# Patient Record
Sex: Male | Born: 1985 | Race: White | Hispanic: No | Marital: Single | Smoking: Former smoker
Health system: Southern US, Community
[De-identification: ages and names within clinical notes are randomized; demographics above are authoritative.]

## PROBLEM LIST (undated history)

## (undated) DIAGNOSIS — J45909 Unspecified asthma, uncomplicated: Secondary | ICD-10-CM

## (undated) DIAGNOSIS — I10 Essential (primary) hypertension: Secondary | ICD-10-CM

---

## 2015-10-02 ENCOUNTER — Encounter (HOSPITAL_COMMUNITY): Payer: Self-pay

## 2015-10-02 ENCOUNTER — Emergency Department (HOSPITAL_COMMUNITY): Payer: PRIVATE HEALTH INSURANCE

## 2015-10-02 ENCOUNTER — Emergency Department (HOSPITAL_COMMUNITY)
Admission: EM | Admit: 2015-10-02 | Discharge: 2015-10-02 | Disposition: A | Discharge status: Home / Self Care | Payer: PRIVATE HEALTH INSURANCE | Attending: Emergency Medicine | Admitting: Emergency Medicine

## 2015-10-02 DIAGNOSIS — R9431 Abnormal electrocardiogram [ECG] [EKG]: Secondary | ICD-10-CM | POA: Diagnosis not present

## 2015-10-02 DIAGNOSIS — Z720 Tobacco use: Secondary | ICD-10-CM | POA: Diagnosis not present

## 2015-10-02 DIAGNOSIS — R079 Chest pain, unspecified: Secondary | ICD-10-CM | POA: Diagnosis present

## 2015-10-02 DIAGNOSIS — R0789 Other chest pain: Secondary | ICD-10-CM | POA: Insufficient documentation

## 2015-10-02 DIAGNOSIS — J45909 Unspecified asthma, uncomplicated: Secondary | ICD-10-CM | POA: Insufficient documentation

## 2015-10-02 HISTORY — DX: Unspecified asthma, uncomplicated: J45.909

## 2015-10-02 LAB — CBC WITH DIFFERENTIAL/PLATELET
BASOS PCT: 0 %
Basophils Absolute: 0 10*3/uL (ref 0.0–0.1)
EOS ABS: 0 10*3/uL (ref 0.0–0.7)
Eosinophils Relative: 0 %
HEMATOCRIT: 41.7 % (ref 39.0–52.0)
Hemoglobin: 14.5 g/dL (ref 13.0–17.0)
LYMPHS ABS: 2 10*3/uL (ref 0.7–4.0)
Lymphocytes Relative: 18 %
MCH: 32.8 pg (ref 26.0–34.0)
MCHC: 34.8 g/dL (ref 30.0–36.0)
MCV: 94.3 fL (ref 78.0–100.0)
MONO ABS: 0.9 10*3/uL (ref 0.1–1.0)
MONOS PCT: 9 %
Neutro Abs: 7.9 10*3/uL — ABNORMAL HIGH (ref 1.7–7.7)
Neutrophils Relative %: 73 %
Platelets: 260 10*3/uL (ref 150–400)
RBC: 4.42 MIL/uL (ref 4.22–5.81)
RDW: 12.1 % (ref 11.5–15.5)
WBC: 10.8 10*3/uL — ABNORMAL HIGH (ref 4.0–10.5)

## 2015-10-02 LAB — BASIC METABOLIC PANEL
Anion gap: 7 (ref 5–15)
BUN: 8 mg/dL (ref 6–20)
CHLORIDE: 102 mmol/L (ref 101–111)
CO2: 26 mmol/L (ref 22–32)
CREATININE: 1.04 mg/dL (ref 0.61–1.24)
Calcium: 8.8 mg/dL — ABNORMAL LOW (ref 8.9–10.3)
GFR calc Af Amer: >60 mL/min (ref >60)
GFR calc non Af Amer: >60 mL/min (ref >60)
GLUCOSE: 127 mg/dL — AB (ref 65–99)
POTASSIUM: 3.8 mmol/L (ref 3.5–5.1)
SODIUM: 135 mmol/L (ref 135–145)

## 2015-10-02 LAB — TROPONIN I: TROPONIN I: 0.03 ng/mL (ref <0.031)

## 2015-10-02 MED ORDER — KETOROLAC TROMETHAMINE 30 MG/ML IJ SOLN
30.0000 mg | Freq: Once | INTRAMUSCULAR | Status: AC
  Administered 2015-10-02: 30 mg via INTRAVENOUS
  Filled 2015-10-02: qty 1

## 2015-10-02 MED ORDER — SODIUM CHLORIDE 0.9 % IV SOLN
INTRAVENOUS | Status: DC
  Administered 2015-10-02: 09:00:00 via INTRAVENOUS

## 2015-10-02 NOTE — Discharge Instructions (Signed)

## 2015-10-02 NOTE — ED Provider Notes (Signed)
CSN: [id number]     Arrival date & time 10/02/15  0818 History   First MD Initiated Contact with Patient 10/02/15 [phone]     Chief Complaint  Patient presents with  . Chest Pain     (Consider location/radiation/quality/duration/timing/severity/associated sxs/prior Treatment) HPI   George Bass is a 29 y.o. male who presents for evaluation of pain in right anterior chest, which awoke him from sleep at 4:30 AM today. The pain was initially severe at 8/10, is now for over 10, spontaneously improved. No associated fever, chills, diaphoresis, nausea or vomiting. He has had some nasal congestion, sneezing, and very mild cough, which he relates to allergies. No prior similar problems. No cardiac history. There are no other known modifying factors.   Past Medical History  Diagnosis Date  . Asthma    No past surgical history on file. History reviewed. No pertinent family history. Social History  Substance Use Topics  . Smoking status: Current Every Day Smoker    Types: Cigarettes  . Smokeless tobacco: None  . Alcohol Use: Yes     Comment: Social drinker    Review of Systems  All other systems reviewed and are negative.     Allergies  Review of patient's allergies indicates no known allergies.  Home Medications   Prior to Admission medications   Medication Sig Start Date End Date Taking? Authorizing Provider  diphenhydrAMINE (BENADRYL) 25 MG tablet Take 25 mg by mouth every 6 (six) hours as needed for itching or allergies.   Yes Historical Provider, MD   BP 124/74 mmHg  Pulse 120  Temp(Src) 98.3 F (36.8 C) (Oral)  Resp 23  SpO2 94% Physical Exam  Constitutional: He is oriented to person, place, and time. He appears well-developed and well-nourished. No distress.  HENT:  Head: Normocephalic and atraumatic.  Right Ear: External ear normal.  Left Ear: External ear normal.  Eyes: Conjunctivae and EOM are normal. Pupils are equal, round, and reactive to light.  Neck:  Normal range of motion and phonation normal. Neck supple.  Cardiovascular: Normal rate, regular rhythm and normal heart sounds.   Pulmonary/Chest: Effort normal and breath sounds normal. No respiratory distress. He has no wheezes. He exhibits no tenderness and no bony tenderness.  Abdominal: Soft. There is no tenderness.  Musculoskeletal: Normal range of motion.  Neurological: He is alert and oriented to person, place, and time. No cranial nerve deficit or sensory deficit. He exhibits normal muscle tone. Coordination normal.  Skin: Skin is warm, dry and intact.  Psychiatric: His behavior is normal. Judgment and thought content normal.  He is anxious  Nursing note and vitals reviewed.   ED Course  Procedures (including critical care time) Medications  0.9 %  sodium chloride infusion ( Intravenous New Bag/Given 10/02/15 0910)  ketorolac (TORADOL) 30 MG/ML injection 30 mg (30 mg Intravenous Given 10/02/15 0910)    Patient Vitals for the past 24 hrs:  BP Temp Temp src Pulse Resp SpO2  10/02/15 0832 124/74 mmHg 98.3 F (36.8 C) Oral 120 23 94 %    9:37 AM Reevaluation with update and discussion. After initial assessment and treatment, an updated evaluation reveals he states pain is better at this time. He denies presyncope or weakness while exercising, lifting weights, which he does twice a week. Findings discussed with the patient, all questions were answered.Mancel Bale L   Labs Review Labs Reviewed  CBC WITH DIFFERENTIAL/PLATELET - Abnormal; Notable for the following:    WBC 10.8 (*)  Neutro Abs 7.9 (*)    All other components within normal limits  BASIC METABOLIC PANEL - Abnormal; Notable for the following:    Glucose, Bld 127 (*)    Calcium 8.8 (*)    All other components within normal limits  TROPONIN I    Imaging Review Dg Chest Port 1 View  10/02/2015   CLINICAL DATA:  Lambert Mody right-sided chest pain and shortness of breath.  EXAM: PORTABLE CHEST 1 VIEW  COMPARISON:   None.  FINDINGS: The heart size and mediastinal contours are within normal limits. Focal opacity in the medial right lower lung zone is likely consistent with acute infiltrate. No edema, pneumothorax or pleural fluid identified. The visualized skeletal structures are unremarkable.  IMPRESSION: Right lower lung infiltrate.   Electronically Signed   By: Irish Lack M.D.   On: 10/02/2015 09:03   I have personally reviewed and evaluated these images and lab results as part of my medical decision-making.   EKG Interpretation   Date/Time:  Saturday October 02 2015 08:29:59 EDT Ventricular Rate:  114 PR Interval:  152 QRS Duration: 118 QT Interval:  317 QTC Calculation: 436 R Axis:   122 Text Interpretation:  Sinus tachycardia Probable left atrial enlargement  RBBB and LPFB ST elevation suggests acute pericarditis No old tracing to  compare Confirmed by [facility]  MD, Jadian Karman ([id number]) on 10/02/2015 8:39:50 AM      MDM   Final diagnoses:  Chest wall pain  Abnormal EKG    Noncardiac chest pain, with chest wall symptoms. ACS, PE or pneumonia. Incidental finding of abnormal EKG. Patient does not have any presyncopal or syncopal symptoms or episodes. Further workup regarding emergency department setting, or hospitalization at this time. Have recommended the patient be follow-up with cardiologist as outpatient. He understands this.  Nursing Notes Reviewed/ Care Coordinated Applicable Imaging Reviewed Interpretation of Laboratory Data incorporated into ED treatment  The patient appears reasonably screened and/or stabilized for discharge and I doubt any other medical condition or other [facility] requiring further screening, evaluation, or treatment in the ED at this time prior to discharge.  Plan: Home Medications- IBU; Home Treatments- heat, rest; return here if the recommended treatment, does not improve the symptoms; Recommended follow up- Cardiology 2-3 weeks     Mancel Bale, MD 10/02/15  [phone]

## 2015-10-02 NOTE — ED Notes (Signed)
He c/o distinctly right-sided chest "shooting" pain which awoke him at ~0500 hours today which persists.  He also stated that he has had some episodes of diaphoresis.  He is breathing normally and his skin is normal, warm and dry.  EKG performed at triage.

## 2015-10-20 ENCOUNTER — Other Ambulatory Visit: Payer: Self-pay | Admitting: Internal Medicine

## 2015-10-20 ENCOUNTER — Ambulatory Visit (INDEPENDENT_AMBULATORY_CARE_PROVIDER_SITE_OTHER): Payer: PRIVATE HEALTH INSURANCE | Admitting: Internal Medicine

## 2015-10-20 ENCOUNTER — Ambulatory Visit (HOSPITAL_COMMUNITY)
Admission: RE | Admit: 2015-10-20 | Discharge: 2015-10-20 | Disposition: A | Discharge status: Home / Self Care | Payer: PRIVATE HEALTH INSURANCE | Source: Ambulatory Visit | Attending: Internal Medicine | Admitting: Internal Medicine

## 2015-10-20 ENCOUNTER — Encounter: Payer: Self-pay | Admitting: Internal Medicine

## 2015-10-20 ENCOUNTER — Ambulatory Visit (HOSPITAL_COMMUNITY)
Admission: RE | Admit: 2015-10-20 | Discharge: 2015-10-20 | Disposition: A | Discharge status: Home / Self Care | Payer: PRIVATE HEALTH INSURANCE | Source: Ambulatory Visit | Attending: Cardiology | Admitting: Cardiology

## 2015-10-20 ENCOUNTER — Other Ambulatory Visit (HOSPITAL_COMMUNITY): Payer: Self-pay | Admitting: Internal Medicine

## 2015-10-20 VITALS — BP 126/82 | HR 90 | Ht 69.0 in | Wt 150.0 lb

## 2015-10-20 DIAGNOSIS — I82409 Acute embolism and thrombosis of unspecified deep veins of unspecified lower extremity: Secondary | ICD-10-CM | POA: Diagnosis not present

## 2015-10-20 DIAGNOSIS — R9431 Abnormal electrocardiogram [ECG] [EKG]: Secondary | ICD-10-CM | POA: Diagnosis not present

## 2015-10-20 DIAGNOSIS — R0781 Pleurodynia: Secondary | ICD-10-CM

## 2015-10-20 DIAGNOSIS — Z87891 Personal history of nicotine dependence: Secondary | ICD-10-CM | POA: Diagnosis not present

## 2015-10-20 DIAGNOSIS — R079 Chest pain, unspecified: Secondary | ICD-10-CM | POA: Diagnosis not present

## 2015-10-20 NOTE — Progress Notes (Signed)
ELECTROPHYSIOLOGY CONSULT NOTE  Patient ID: George Bass, MRN: [id number], DOB/AGE: 1986/07/01 29 y.o. Admit date: (Not on file) Date of Consult: 10/20/2015  Primary Physician: No PCP Per Patient Primary Cardiologist: *new  Chief Complaint: abnormal ECG   HPI George GianottiJonathon Hust is a 29 y.o. male  Referred from the emergency room because of atypical chest pain. His electrocardiogram at that time had demonstrated right bundle branch block left posterior fascicular block and there is sinus tachycardia.  His chest pain was relatively abrupt in onset and was pleuritic. It was associated with shortness of breath which is somewhat persistent over the last week or 2.   He has no family history of clotting disorders. He has no prior history of DVT or pulmonary embolism.   He has a history of prior ECG obtained on he was in the [employer] [place]  He has no way of obtaining that record.     Surgical History: No past surgical history on file.   Home Meds: Prior to Admission medications   Medication Sig Start Date End Date Taking? Authorizing Provider  diphenhydrAMINE (BENADRYL) 25 MG tablet Take 25 mg by mouth every 6 (six) hours as needed for itching or allergies.    Historical Provider, MD    Allergies: No Known Allergies  Social History   Social History  . Marital Status: Single    Spouse Name: N/A  . Number of Children: N/A  . Years of Education: N/A   Occupational History  . Not on file.   Social History Main Topics  . Smoking status: Current Every Day Smoker    Types: Cigarettes  . Smokeless tobacco: Not on file  . Alcohol Use: Yes     Comment: Social drinker  . Drug Use: Not on file  . Sexual Activity: Not on file   Other Topics Concern  . Not on file   Social History Narrative     No family history on file.   ROS:  Please see the history of present illness.     All other systems reviewed and negative.    Physical Exam: Blood pressure 126/82, pulse 90,  height 5\' 9"  (1.753 m), weight 150 lb (68.04 kg). General: Well developed, well nourished male in no acute distress. Head: Normocephalic, atraumatic, sclera non-icteric, no xanthomas, nares are without discharge. EENT: normal  Lymph Nodes:  none Neck: Negative for carotid bruits. JVD not elevated. Back:without scoliosis kyphosis  Lungs: Clear bilaterally to auscultation without wheezes, rales, or rhonchi. Breathing is unlabored. Heart: RRR with S1 S2. Possibly fixed split No * murmur . No rubs, or gallops appreciated. Abdomen: Soft, non-tender, non-distended with normoactive bowel sounds. No hepatomegaly. No rebound/guarding. No obvious abdominal masses. Msk:  Strength and tone appear normal for age. Extremities: No clubbing or cyanosis. N edema.  Distal pedal pulses are 2+ and equal bilaterally. Skin: Warm and Dry Neuro: Alert and oriented X 3. CN III-XII intact Grossly normal sensory and motor function . Psych:  Responds to questions appropriately with a normal affect.      Labs: Cardiac Enzymes No results for input(s): CKTOTAL, CKMB, TROPONINI in the last 72 hours. CBC Lab Results  Component Value Date   WBC 10.8* 10/02/2015   HGB 14.5 10/02/2015   HCT 41.7 10/02/2015   MCV 94.3 10/02/2015   PLT 260 10/02/2015   PROTIME: No results for input(s): LABPROT, INR in the last 72 hours. Chemistry No results for input(s): NA, K, CL, CO2, BUN, CREATININE, CALCIUM,  PROT, BILITOT, ALKPHOS, ALT, AST, GLUCOSE in the last 168 hours.  Invalid input(s): LABALBU Lipids No results found for: CHOL, HDL, LDLCALC, TRIG BNP No results found for: PROBNP Thyroid Function Tests: No results for input(s): TSH, T4TOTAL, T3FREE, THYROIDAB in the last 72 hours.  Invalid input(s): FREET3 Miscellaneous No results found for: DDIMER  Radiology/Studies:  Dg Chest Port 1 View  10/02/2015  CLINICAL DATA:  Lambert Mody right-sided chest pain and shortness of breath. EXAM: PORTABLE CHEST 1 VIEW COMPARISON:   None. FINDINGS: The heart size and mediastinal contours are within normal limits. Focal opacity in the medial right lower lung zone is likely consistent with acute infiltrate. No edema, pneumothorax or pleural fluid identified. The visualized skeletal structures are unremarkable. IMPRESSION: Right lower lung infiltrate. Electronically Signed   By: Irish Lack M.D.   On: 10/02/2015 09:03    EKG:  Sinus rhythm at 90 intervals 16/11/35 incomplete right bundle branch block Right axis deviation  ECG from the hospital demonstrated sinus rhythm at 114 Intervals 15/12/32 Axis is 122 Right bundle branch left posterior fascicular block ST segment elevation was suggested although not really much appreciated by me. A Premier has had a in this area of the tendon repair at [facility] has regularly outside)  Assessment and Plan   Pleuritic chest pain   Right bundle-branch left posterior fascicular block   The patient presented with pleuritic chest pain right bundle branch block and left posterior fascicular block. He's had some shortness of breath since then. Other no risk factors for pulmonary embolism, this diagnosis still needs to be excluded. We will undertake venous Dopplers and if these are negative undertake a CT scan. There are data that 40 more than 40% of clot persists after one of 2 weeks of anticoagulation. This would suggest that a CT scan was still be abnormal.  Given his bifascicular block, I'm not clearly able to appreciate movement of his S2. We will undertake a echocardiogram to look for an ASD.

## 2015-10-20 NOTE — Patient Instructions (Addendum)
Medication Instructions: - no changes  Labwork: - none  Procedures/Testing: - Your physician has requested that you have a lower extremity venous duplex. This test is an ultrasound of the veins in the legs. It looks at venous blood flow that carries blood from the heart to the legs. Allow one hour for a Lower Venous exam. There are no restrictions or special instructions.  - Your physician has requested that you have an echocardiogram with bubble study. Echocardiography is a painless test that uses sound waves to create images of your heart. It provides your doctor with information about the size and shape of your heart and how well your heart's chambers and valves are working. This procedure takes approximately one hour. There are no restrictions for this procedure.  Follow-Up: - to be determined based on testing.  Any Additional Special Instructions Will Be Listed Below (If Applicable). - none

## 2015-10-22 ENCOUNTER — Telehealth: Payer: Self-pay | Admitting: *Deleted

## 2015-10-22 DIAGNOSIS — R0781 Pleurodynia: Secondary | ICD-10-CM

## 2015-10-22 NOTE — Telephone Encounter (Signed)
Duke SalviaSteven C Klein, MD   Sent: Caleen EssexFri October 22, 2015 2:23 PM    To: Jefferey PicaHeather C McGhee, RN; No Pcp Per Patient; Deliah BostonKelly F Lanier, RN; Baird LyonsSherri L Marguriete Wootan, RN        Result Note     Please Inform Patient that study was normal Can we trry and arrange a CT for PE ASAP thanks         Thanks    Called patient and informed him of need for CT scan.  Patient aware office will call him to arrange this. Spoke with Jasmine DecemberSharon, at checkout, who is going to call patient today.

## 2015-10-26 ENCOUNTER — Ambulatory Visit (HOSPITAL_COMMUNITY): Payer: PRIVATE HEALTH INSURANCE | Attending: Cardiovascular Disease

## 2015-10-26 ENCOUNTER — Other Ambulatory Visit: Payer: Self-pay

## 2015-10-26 ENCOUNTER — Other Ambulatory Visit: Payer: Self-pay | Admitting: Internal Medicine

## 2015-10-26 ENCOUNTER — Ambulatory Visit (INDEPENDENT_AMBULATORY_CARE_PROVIDER_SITE_OTHER)
Admission: RE | Admit: 2015-10-26 | Discharge: 2015-10-26 | Disposition: A | Discharge status: Home / Self Care | Payer: PRIVATE HEALTH INSURANCE | Source: Ambulatory Visit | Attending: Internal Medicine | Admitting: Internal Medicine

## 2015-10-26 DIAGNOSIS — F17200 Nicotine dependence, unspecified, uncomplicated: Secondary | ICD-10-CM | POA: Diagnosis not present

## 2015-10-26 DIAGNOSIS — R079 Chest pain, unspecified: Secondary | ICD-10-CM | POA: Insufficient documentation

## 2015-10-26 DIAGNOSIS — R0781 Pleurodynia: Secondary | ICD-10-CM | POA: Diagnosis not present

## 2015-10-26 MED ORDER — IOHEXOL 350 MG/ML SOLN
80.0000 mL | Freq: Once | INTRAVENOUS | Status: AC | PRN
  Administered 2015-10-26: 80 mL via INTRAVENOUS

## 2015-11-08 ENCOUNTER — Other Ambulatory Visit: Payer: Self-pay | Admitting: Internal Medicine

## 2015-11-08 DIAGNOSIS — I82409 Acute embolism and thrombosis of unspecified deep veins of unspecified lower extremity: Secondary | ICD-10-CM

## 2015-11-08 DIAGNOSIS — R0781 Pleurodynia: Secondary | ICD-10-CM

## 2016-06-28 ENCOUNTER — Emergency Department (HOSPITAL_COMMUNITY): Payer: PRIVATE HEALTH INSURANCE

## 2016-06-28 ENCOUNTER — Emergency Department (HOSPITAL_COMMUNITY)
Admission: EM | Admit: 2016-06-28 | Discharge: 2016-06-28 | Disposition: A | Discharge status: Home / Self Care | Payer: PRIVATE HEALTH INSURANCE | Attending: Emergency Medicine | Admitting: Emergency Medicine

## 2016-06-28 ENCOUNTER — Encounter (HOSPITAL_COMMUNITY): Payer: Self-pay | Admitting: *Deleted

## 2016-06-28 DIAGNOSIS — J45909 Unspecified asthma, uncomplicated: Secondary | ICD-10-CM | POA: Insufficient documentation

## 2016-06-28 DIAGNOSIS — Y939 Activity, unspecified: Secondary | ICD-10-CM | POA: Diagnosis not present

## 2016-06-28 DIAGNOSIS — R55 Syncope and collapse: Secondary | ICD-10-CM | POA: Insufficient documentation

## 2016-06-28 DIAGNOSIS — Y92410 Unspecified street and highway as the place of occurrence of the external cause: Secondary | ICD-10-CM | POA: Diagnosis not present

## 2016-06-28 DIAGNOSIS — Y999 Unspecified external cause status: Secondary | ICD-10-CM | POA: Diagnosis not present

## 2016-06-28 DIAGNOSIS — F17210 Nicotine dependence, cigarettes, uncomplicated: Secondary | ICD-10-CM | POA: Insufficient documentation

## 2016-06-28 LAB — I-STAT CHEM 8, ED
BUN: 7 mg/dL (ref 6–20)
CALCIUM ION: 1.01 mmol/L — AB (ref 1.13–1.30)
Chloride: 108 mmol/L (ref 101–111)
Creatinine, Ser: 1.3 mg/dL — ABNORMAL HIGH (ref 0.61–1.24)
GLUCOSE: 112 mg/dL — AB (ref 65–99)
HCT: 49 % (ref 39.0–52.0)
Hemoglobin: 16.7 g/dL (ref 13.0–17.0)
Potassium: 4.2 mmol/L (ref 3.5–5.1)
SODIUM: 145 mmol/L (ref 135–145)
TCO2: 27 mmol/L (ref 0–100)

## 2016-06-28 NOTE — Discharge Instructions (Signed)
Take over the counter tylenol and ibuprofen, as directed on packaging, as needed for discomfort. Increase your fluid intake for the next few days, as discussed. Apply moist heat or ice to any area(s) of discomfort, for 15 minutes at a time, several times per day for the next few days.  Do not fall asleep on a heating or ice pack.  Call your regular medical doctor tomorrow to schedule a follow up appointment this week.  Return to the Emergency Department immediately if worsening.
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
## 2020-02-03 ENCOUNTER — Encounter: Payer: Self-pay | Admitting: General Practice

## 2025-01-24 ENCOUNTER — Inpatient Hospital Stay (HOSPITAL_COMMUNITY)
Admission: RE | Admit: 2025-01-24 | Discharge: 2025-01-24 | Payer: Self-pay | Attending: Internal Medicine | Admitting: Internal Medicine

## 2025-01-24 ENCOUNTER — Other Ambulatory Visit: Payer: Self-pay

## 2025-01-24 ENCOUNTER — Encounter (HOSPITAL_COMMUNITY): Payer: Self-pay

## 2025-01-24 VITALS — BP 159/109 | HR 107 | Temp 98.2°F | Resp 18

## 2025-01-24 DIAGNOSIS — R051 Acute cough: Secondary | ICD-10-CM

## 2025-01-24 DIAGNOSIS — R062 Wheezing: Secondary | ICD-10-CM

## 2025-01-24 DIAGNOSIS — J069 Acute upper respiratory infection, unspecified: Secondary | ICD-10-CM

## 2025-01-24 HISTORY — DX: Essential (primary) hypertension: I10

## 2025-01-24 MED ORDER — ALBUTEROL SULFATE HFA 108 (90 BASE) MCG/ACT IN AERS: 1.0000 | INHALATION_SPRAY | Freq: Four times a day (QID) | RESPIRATORY_TRACT | 0 refills | Status: AC | PRN

## 2025-01-24 MED ORDER — AZITHROMYCIN 250 MG PO TABS: 250.0000 mg | ORAL_TABLET | Freq: Every day | ORAL | 0 refills | Status: AC

## 2025-01-24 MED ORDER — BENZONATATE 100 MG PO CAPS: 100.0000 mg | ORAL_CAPSULE | Freq: Three times a day (TID) | ORAL | 0 refills | Status: AC

## 2025-01-24 NOTE — ED Triage Notes (Signed)
 C/O lingering cough over past month, with some worsening over past few days.  Drinking tea, taking Mucinex and cough bombs.  Denies fevers.

## 2025-01-24 NOTE — Discharge Instructions (Addendum)
 You have been diagnosed with upper respiratory infection due to your symptoms been ongoing for greater than 1 month you have been prescribed azithromycin  (Z-Pak) to take as directed.  You have also been prescribed a albuterol  inhaler for the wheezing.  You have also been prescribed benzonatate  100 mg every 8 hours as needed for cough. You also have elevated blood pressure.  You have been encouraged to continue to monitor this and to take the necessary precautions especially due to your family history, DASH diet, avoiding over-the-counter medications that can raise blood pressure encouraged to use Coricidin HBP in the future for symptom relief measures Mucinex and Sudafed You are encouraged to follow-up with urgent care if your symptoms persist or get worse for imaging and additional testing.

## 2025-01-24 NOTE — ED Provider Notes (Signed)
 " [facility]    CSN: [id number] Arrival date & time: 01/24/25  0856      History   Chief Complaint Chief Complaint  Patient presents with   Wheezing   Cough    HPI George Bass is a 39 y.o. male.   HPI He is in today for evaluation of persistent cough and wheezing that is progressively getting worse.  He is also experiencing nasal congestion. Denies fever, cough, shortness of breath, runny nose, diarrhea, new loss of smell or taste, sneezing, new sore throat or ear pain   Past Medical History:  Diagnosis Date   Asthma    as a child   Hypertension     There are no active problems to display for this patient.   Past Surgical History:  Procedure Laterality Date   VASECTOMY         Home Medications    Prior to Admission medications  Medication Sig Start Date End Date Taking? Authorizing Provider  albuterol  (VENTOLIN  HFA) 108 (90 Base) MCG/ACT inhaler Inhale 1-2 puffs into the lungs every 6 (six) hours as needed for wheezing or shortness of breath. 01/24/25  Yes Myrna Camelia HERO, NP  albuterol  (VENTOLIN  HFA) 108 (90 Base) MCG/ACT inhaler Inhale 1-2 puffs into the lungs every 6 (six) hours as needed for wheezing or shortness of breath. 01/24/25  Yes Myrna Camelia HERO, NP  azithromycin  (ZITHROMAX ) 250 MG tablet Take 1 tablet (250 mg total) by mouth daily. Take first 2 tablets together, then 1 every day until finished. 01/24/25  Yes Myrna Camelia HERO, NP  benzonatate  (TESSALON ) 100 MG capsule Take 1 capsule (100 mg total) by mouth every 8 (eight) hours. 01/24/25  Yes Myrna Camelia HERO, NP    Family History History reviewed. No pertinent family history.  Social History Social History[1]   Allergies   Patient has no known allergies.   Review of Systems Review of Systems   Physical Exam Triage Vital Signs ED Triage Vitals  Encounter Vitals Group     BP 01/24/25 0903 (!) 159/109     Girls Systolic BP Percentile --      Girls Diastolic BP Percentile --       Boys Systolic BP Percentile --      Boys Diastolic BP Percentile --      Pulse Rate 01/24/25 0903 (!) 107     Resp 01/24/25 0903 18     Temp 01/24/25 0903 98.2 F (36.8 C)     Temp Source 01/24/25 0903 Oral     SpO2 01/24/25 0903 93 %     Weight --      Height --      Head Circumference --      Peak Flow --      Pain Score 01/24/25 0905 0     Pain Loc --      Pain Education --      Exclude from Growth Chart --    No data found.  Updated Vital Signs BP (!) 159/109   Pulse (!) 107   Temp 98.2 F (36.8 C) (Oral)   Resp 18   SpO2 93%   Visual Acuity Right Eye Distance:   Left Eye Distance:   Bilateral Distance:    Right Eye Near:   Left Eye Near:    Bilateral Near:     Physical Exam Constitutional:      Appearance: He is normal weight.  HENT:     Head: Normocephalic.  Right Ear: Tympanic membrane normal.     Left Ear: Tympanic membrane normal.     Nose: Nose normal.     Mouth/Throat:     Mouth: Mucous membranes are moist.     Pharynx: Oropharynx is clear.  Eyes:     Pupils: Pupils are equal, round, and reactive to light.  Cardiovascular:     Rate and Rhythm: Tachycardia present.  Pulmonary:     Effort: Pulmonary effort is normal.     Breath sounds: Wheezing present.  Abdominal:     General: Abdomen is flat.  Musculoskeletal:        General: Normal range of motion.     Cervical back: Normal range of motion and neck supple.  Skin:    General: Skin is warm and dry.     Capillary Refill: Capillary refill takes less than 2 seconds.  Neurological:     General: No focal deficit present.     Mental Status: He is alert and oriented to person, place, and time.  Psychiatric:        Mood and Affect: Mood normal.      UC Treatments / Results  Labs (all labs ordered are listed, but only abnormal results are displayed) Labs Reviewed - No data to display  EKG   Radiology No results found.  Procedures Procedures (including critical care  time)  Medications Ordered in UC Medications - No data to display  Initial Impression / Assessment and Plan / UC Course  I have reviewed the triage vital signs and the nursing notes.  Pertinent labs & imaging results that were available during my care of the patient were reviewed by me and considered in my medical decision making (see chart for details).     URI Final Clinical Impressions(s) / UC Diagnoses   Final diagnoses:  Wheezing  Upper respiratory tract infection, unspecified type  Acute cough  39 year old male in today for evaluation of upper respiratory symptoms that has been persistent over the last month.  He endorses that he has been diagnosed with hypertension in the past however is not treating it repeat blood pressure 150/90.  He works as a investment banker, operational.  He denies any known exposures to COVID or flu.  He does have small children at home that have also been having some cough and congestion but no definitive diagnosis.  He endorses that he is having head congestion his major concern today is the wheezing and cough.  He endorses that he does have a remote history of pneumonia as a child and did suffer from recurrent bronchilitis growing up.  He was prescribed azithromycin  due to chest congestion along with albuterol  and benzonatate  for additional symptom relief.    Discharge Instructions      You have been diagnosed with upper respiratory infection due to your symptoms been ongoing for greater than 1 month you have been prescribed azithromycin  (Z-Pak) to take as directed.  You have also been prescribed a albuterol  inhaler for the wheezing.  You have also been prescribed benzonatate  100 mg every 8 hours as needed for cough. You also have elevated blood pressure.  You have been encouraged to continue to monitor this and to take the necessary precautions especially due to your family history, DASH diet, avoiding over-the-counter medications that can raise blood pressure encouraged to use  Coricidin HBP in the future for symptom relief measures Mucinex and Sudafed You are encouraged to follow-up with urgent care if your symptoms persist or get worse for imaging  and additional testing.     ED Prescriptions     Medication Sig Dispense Auth. Provider   azithromycin  (ZITHROMAX ) 250 MG tablet Take 1 tablet (250 mg total) by mouth daily. Take first 2 tablets together, then 1 every day until finished. 6 tablet Myrna Camelia HERO, NP   benzonatate  (TESSALON ) 100 MG capsule Take 1 capsule (100 mg total) by mouth every 8 (eight) hours. 21 capsule Myrna Camelia HERO, NP   albuterol  (VENTOLIN  HFA) 108 (90 Base) MCG/ACT inhaler Inhale 1-2 puffs into the lungs every 6 (six) hours as needed for wheezing or shortness of breath. 25.5 g Myrna Camelia HERO, NP   albuterol  (VENTOLIN  HFA) 108 (90 Base) MCG/ACT inhaler Inhale 1-2 puffs into the lungs every 6 (six) hours as needed for wheezing or shortness of breath. 8 g Myrna Camelia HERO, NP      PDMP not reviewed this encounter.     [1]  Social History Tobacco Use   Smoking status: Former    Types: Cigarettes  Vaping Use   Vaping status: Never Used  Substance Use Topics   Alcohol use: Yes    Alcohol/week: 5.0 - 7.0 standard drinks of alcohol    Types: 5 - 7 Cans of beer per week   Drug use: Not Currently    Types: Marijuana     Myrna Camelia HERO, NP 01/28/25 1720  "
